# Patient Record
Sex: Male | Born: 1995 | Race: White | Hispanic: No | Marital: Single | Smoking: Current every day smoker
Health system: Southern US, Community
[De-identification: ages and names within clinical notes are randomized; demographics above are authoritative.]

---

## 2016-09-13 ENCOUNTER — Emergency Department: Payer: Self-pay

## 2016-09-13 ENCOUNTER — Emergency Department
Admission: EM | Admit: 2016-09-13 | Discharge: 2016-09-13 | Disposition: A | Discharge status: Home / Self Care | Payer: Self-pay | Attending: Emergency Medicine | Admitting: Emergency Medicine

## 2016-09-13 ENCOUNTER — Encounter: Payer: Self-pay | Admitting: Emergency Medicine

## 2016-09-13 DIAGNOSIS — F17210 Nicotine dependence, cigarettes, uncomplicated: Secondary | ICD-10-CM | POA: Insufficient documentation

## 2016-09-13 DIAGNOSIS — R079 Chest pain, unspecified: Secondary | ICD-10-CM | POA: Insufficient documentation

## 2016-09-13 DIAGNOSIS — R0602 Shortness of breath: Secondary | ICD-10-CM | POA: Insufficient documentation

## 2016-09-13 LAB — CBC
HEMATOCRIT: 49.3 % (ref 40.0–52.0)
HEMOGLOBIN: 16.6 g/dL (ref 13.0–18.0)
MCH: 32.8 pg (ref 26.0–34.0)
MCHC: 33.7 g/dL (ref 32.0–36.0)
MCV: 97.5 fL (ref 80.0–100.0)
Platelets: 272 10*3/uL (ref 150–440)
RBC: 5.05 MIL/uL (ref 4.40–5.90)
RDW: 12.7 % (ref 11.5–14.5)
WBC: 8.3 10*3/uL (ref 3.8–10.6)

## 2016-09-13 LAB — TROPONIN I

## 2016-09-13 LAB — BASIC METABOLIC PANEL
ANION GAP: 9 (ref 5–15)
BUN: 14 mg/dL (ref 6–20)
CALCIUM: 9.3 mg/dL (ref 8.9–10.3)
CO2: 26 mmol/L (ref 22–32)
Chloride: 103 mmol/L (ref 101–111)
Creatinine, Ser: 0.75 mg/dL (ref 0.61–1.24)
GFR calc Af Amer: >60 mL/min (ref >60)
Glucose, Bld: 103 mg/dL — ABNORMAL HIGH (ref 65–99)
POTASSIUM: 3.7 mmol/L (ref 3.5–5.1)
SODIUM: 138 mmol/L (ref 135–145)

## 2016-09-13 LAB — FIBRIN DERIVATIVES D-DIMER (ARMC ONLY): FIBRIN DERIVATIVES D-DIMER (ARMC): 67 (ref 0–499)

## 2016-09-13 MED ORDER — GI COCKTAIL ~~LOC~~
30.0000 mL | Freq: Once | ORAL | Status: AC
  Administered 2016-09-13: 30 mL via ORAL
  Filled 2016-09-13: qty 30

## 2016-09-13 MED ORDER — FAMOTIDINE 40 MG PO TABS: 40.0000 mg | ORAL_TABLET | Freq: Every evening | ORAL | 0 refills | Status: AC

## 2016-09-13 MED ORDER — TRAMADOL HCL 50 MG PO TABS
50.0000 mg | ORAL_TABLET | Freq: Once | ORAL | Status: AC
  Administered 2016-09-13: 50 mg via ORAL
  Filled 2016-09-13: qty 1

## 2016-09-13 MED ORDER — ACETAMINOPHEN 325 MG PO TABS
650.0000 mg | ORAL_TABLET | Freq: Once | ORAL | Status: AC
  Administered 2016-09-13: 650 mg via ORAL
  Filled 2016-09-13: qty 2

## 2016-09-13 NOTE — ED Notes (Signed)
Pt verbalized understanding of discharge instructions. NAD at this time. 

## 2016-09-13 NOTE — ED Provider Notes (Signed)
[facility] Emergency Department Provider Note   ____________________________________________   First MD Initiated Contact with Patient 09/13/16 [phone]     (approximate)  I have reviewed the triage vital signs and the nursing notes.   HISTORY  Chief Complaint Chest Pain    HPI Rodney Valentine is a 20 y.o. male who comes into the hospital today with some inflammation to his rib cage. He reports it is pushing on his chest cavity. He reports that he did have symptoms of this a few months ago and it went away with some ibuprofen. The patient woke up out of a sleep tonight with this pain. At time of left side of his chest. The patient reports that he came to find out why this keeps happening. He took some ibuprofen and then he reports that the pain went from being constant to intermittent. The patient reports that he took it around 3 AM. The patient rates his pain a 6 out of 10 in intensity. He reports that when he was seen last time he did not follow-up. He's had some shortness of breath with pain worse when he takes a deep breath. The patient denies any sweats dizziness, lightheadedness. He has some abdominal pain initially that improved. The patient also denies any nausea or vomiting. The patient is here for evaluation.   History reviewed. No pertinent past medical history.  There are no active problems to display for this patient.   History reviewed. No pertinent surgical history.  Prior to Admission medications   Medication Sig Start Date End Date Taking? Authorizing Provider  famotidine (PEPCID) 40 MG tablet Take 1 tablet (40 mg total) by mouth every evening. 09/13/16 09/13/17  Rebecka ApleyAllison P Anairis Knick, MD    Allergies Patient has no known allergies.  History reviewed. No pertinent family history.  Social History Social History  Substance Use Topics  . Smoking status: Current Every Day Smoker    Packs/day: 1.00    Types: Cigarettes  . Smokeless tobacco:  Never Used  . Alcohol use Yes    Review of Systems Constitutional: No fever/chills Eyes: No visual changes. ENT: No sore throat. Cardiovascular:  chest pain. Respiratory:  shortness of breath. Gastrointestinal: No abdominal pain.  No nausea, no vomiting.  No diarrhea.  No constipation. Genitourinary: Negative for dysuria. Musculoskeletal: Negative for back pain. Skin: Negative for rash. Neurological: Negative for headaches, focal weakness or numbness.  10-point ROS otherwise negative.  ____________________________________________   PHYSICAL EXAM:  VITAL SIGNS: ED Triage Vitals  Enc Vitals Group     BP 09/13/16 0431 122/64     Pulse Rate 09/13/16 0431 62     Resp 09/13/16 0431 18     Temp 09/13/16 0431 97.6 F (36.4 C)     Temp Source 09/13/16 0431 Oral     SpO2 09/13/16 0431 100 %     Weight 09/13/16 0429 125 lb (56.7 kg)     Height 09/13/16 0429 5\' 11"  (1.803 m)     Head Circumference --      Peak Flow --      Pain Score 09/13/16 0429 5     Pain Loc --      Pain Edu? --      Excl. in GC? --     Constitutional: Alert and oriented. Well appearing and in Mild distress. Eyes: Conjunctivae are normal. PERRL. EOMI. Head: Atraumatic. Nose: No congestion/rhinnorhea. Mouth/Throat: Mucous membranes are moist.  Oropharynx non-erythematous. Cardiovascular: Normal rate, regular rhythm. Grossly normal  heart sounds.  Good peripheral circulation. Respiratory: Normal respiratory effort.  No retractions. Lungs CTAB. Gastrointestinal: Soft and nontender. No distention. Positive bowel sounds Musculoskeletal: No lower extremity tenderness nor edema.   Neurologic:  Normal speech and language.  Skin:  Skin is warm, dry and intact.  Psychiatric: Mood and affect are normal.   ____________________________________________   LABS (all labs ordered are listed, but only abnormal results are displayed)  Labs Reviewed  BASIC METABOLIC PANEL - Abnormal; Notable for the following:        Result Value   Glucose, Bld 103 (*)    All other components within normal limits  CBC  TROPONIN I  FIBRIN DERIVATIVES D-DIMER ([facility] ONLY)   ____________________________________________  EKG  ED ECG REPORT I, Rebecka ApleyWebster,  Koston Hennes P, the attending physician, personally viewed and interpreted this ECG.   Date: 09/13/2016  EKG Time: 430  Rate: 67  Rhythm: normal sinus rhythm  Axis: normal  Intervals:none  ST&T Change: none  ____________________________________________  RADIOLOGY  CXR ____________________________________________   PROCEDURES  Procedure(s) performed: None  Procedures  Critical Care performed: No  ____________________________________________   INITIAL IMPRESSION / ASSESSMENT AND PLAN / ED COURSE  Pertinent labs & imaging results that were available during my care of the patient were reviewed by me and considered in my medical decision making (see chart for details).  This is a 20 year old male who comes into the hospital today with chest pain. The patient reports that he's had this pain before was told that it was due to inflammation but he does not know exactly why he keeps having it. I will give the patient a dose of tramadol as well as Tylenol. His initial d-dimer BNP CBC and troponin all negative. He'll receive a chest x-ray  Clinical Course as of Sep 13 744  Wynelle LinkSun Sep 13, 2016  [id number] No active cardiopulmonary disease. DG Chest 2 View [AW]    Clinical Course User Index [AW] Rebecka ApleyAllison P Saida Lonon, MD   The patient reports that after the medication he feels much improved. He reports that he thinks this could be due to his reflux as this pain did improve after the GI cocktail. I will discharge the patient to home and have him follow-up with his primary care physician. The patient understands and agrees with the plans as stated. He'll be discharged home.  ____________________________________________   FINAL CLINICAL IMPRESSION(S) / ED DIAGNOSES  Final  diagnoses:  Chest pain, unspecified type      NEW MEDICATIONS STARTED DURING THIS VISIT:  New Prescriptions   FAMOTIDINE (PEPCID) 40 MG TABLET    Take 1 tablet (40 mg total) by mouth every evening.     Note:  This document was prepared using Dragon voice recognition software and may include unintentional dictation errors.    Rebecka ApleyAllison P Haygen Zebrowski, MD 09/13/16 [phone]

## 2016-09-13 NOTE — ED Triage Notes (Addendum)
Pt reports 1 hour of pressure/tightness to the left side of his chest; some tightness in his left upper back; history of "inflammation" around his heart; pt says this feels similar but stronger pain; pt awake, alert and oriented x 3; talking in complete coherent sentences; pt adds pain woke him from sleep, feeling some better after taking Ibuprofen

## 2016-09-13 NOTE — ED Notes (Signed)
IV attempt unsuccessful by paramedic student Huntley DecSara to right arm.

## 2017-10-13 IMAGING — CR DG CHEST 2V
2 series · 2 of 2 positions shown · non-contrast
Comparison: None.

CLINICAL DATA: 20 y/o  M; chest pressure and tightness.

EXAM:
CHEST  2 VIEW

[chest pa]
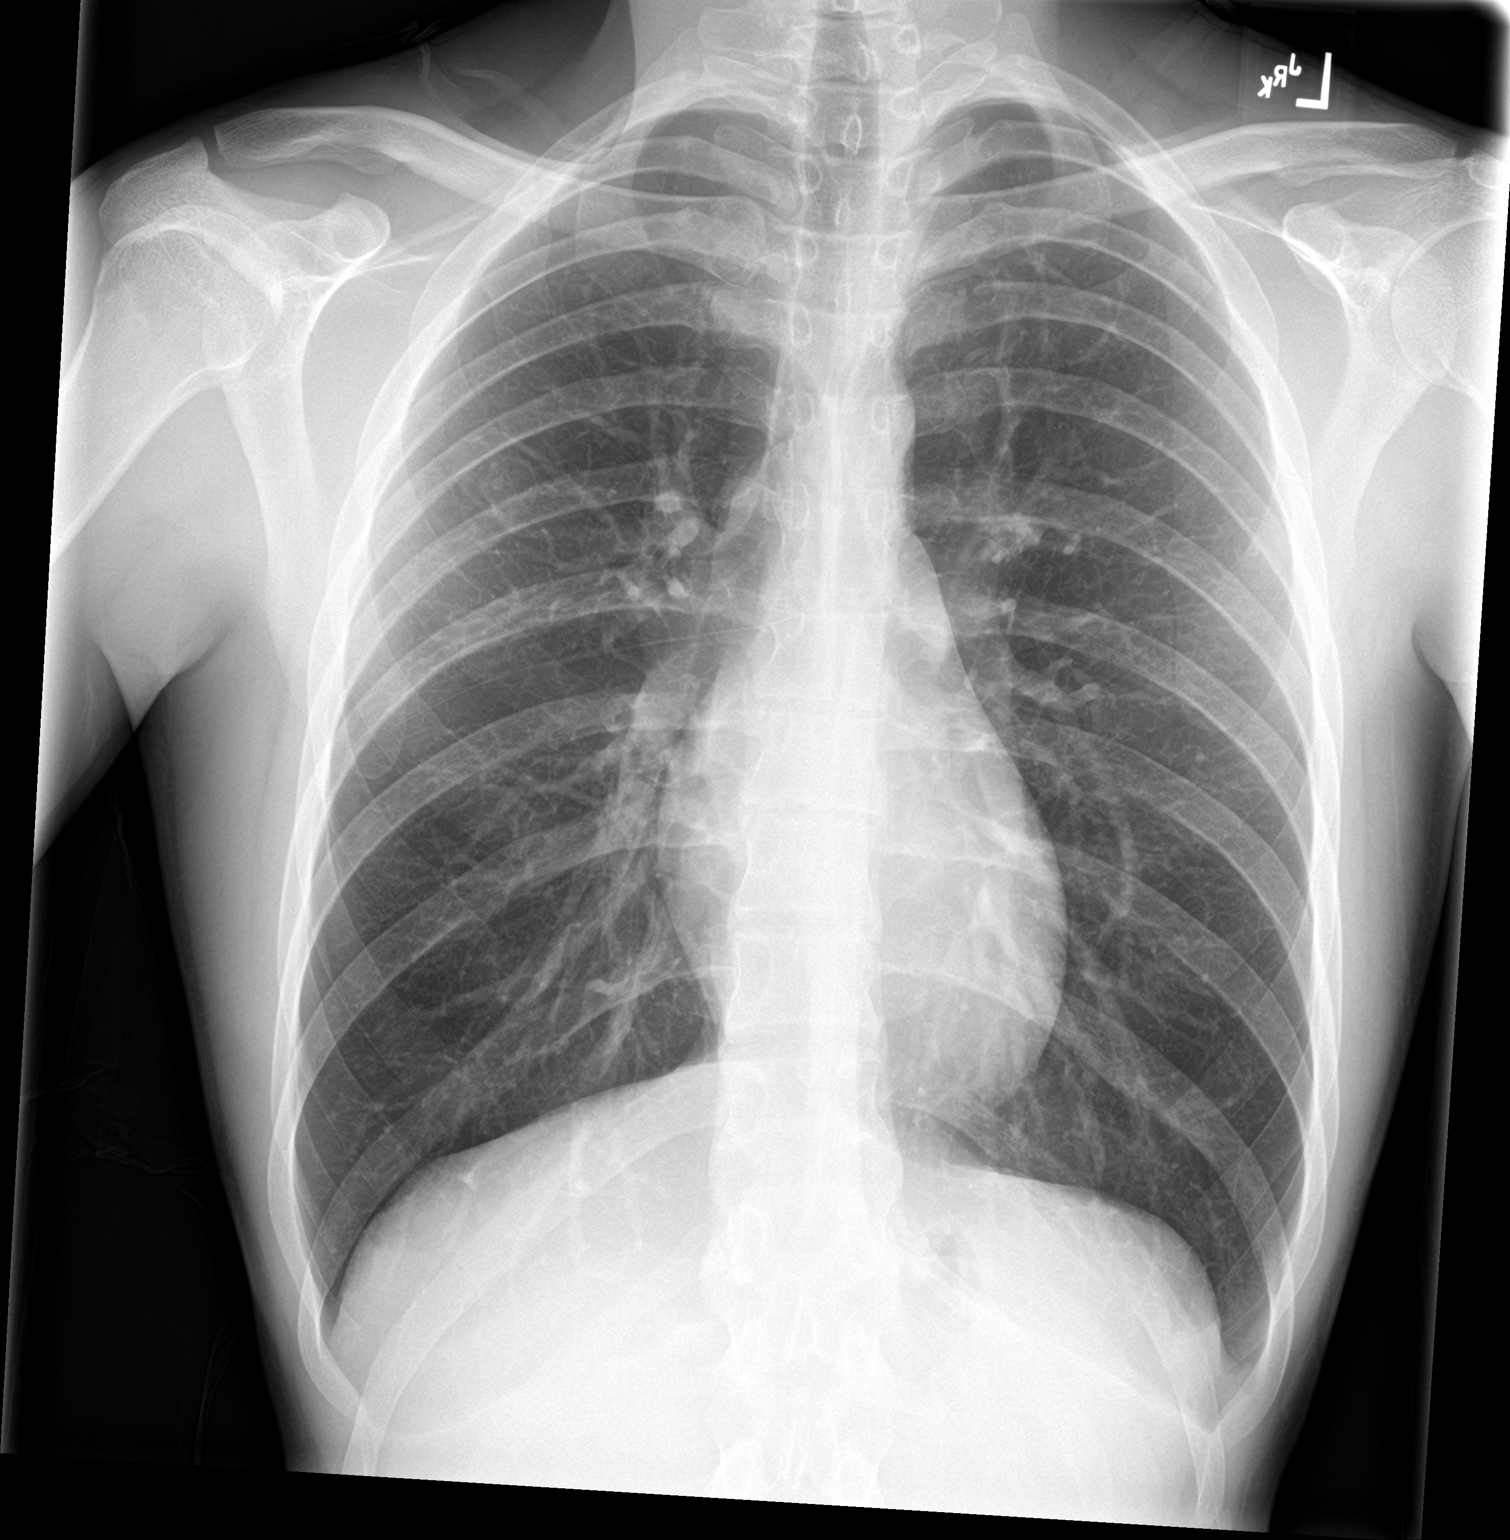

[chest lat]
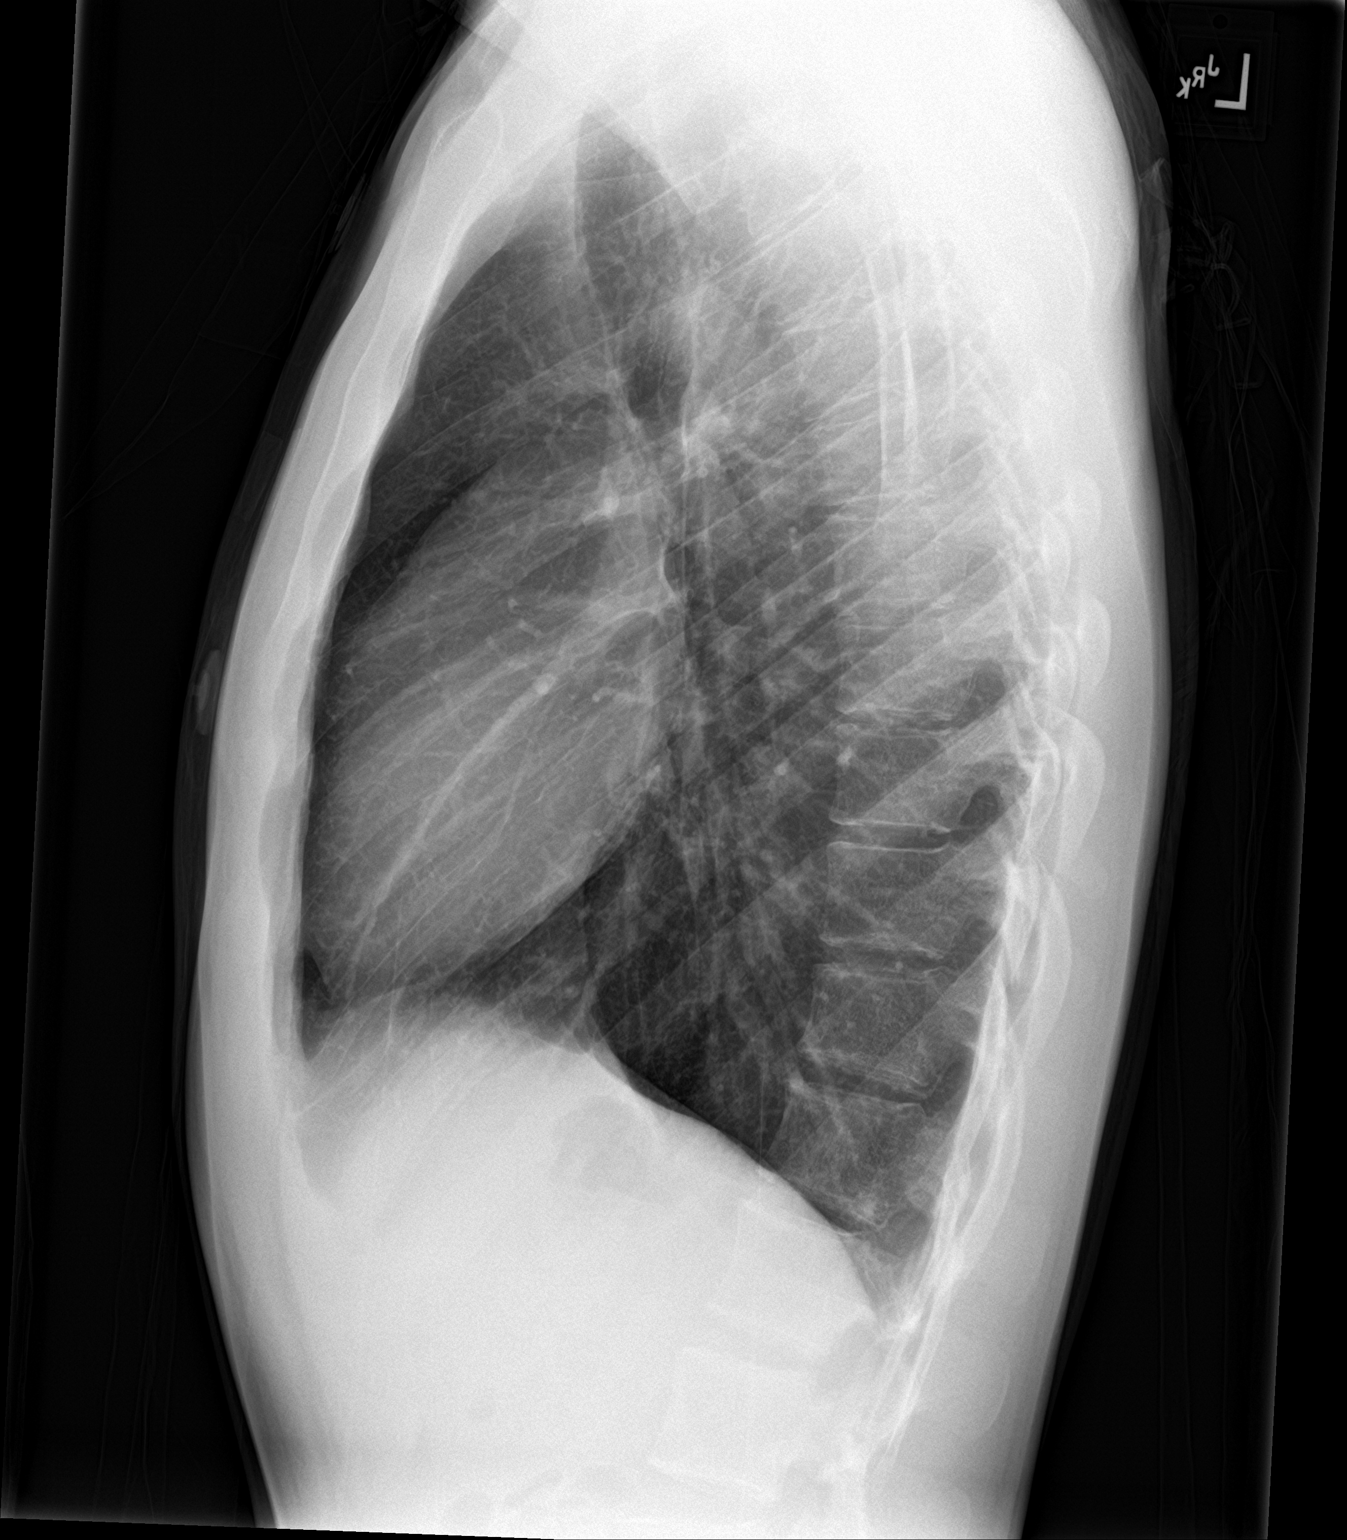

[2 of 2 positions shown; findings below may reference images not displayed]

FINDINGS: The heart size and mediastinal contours are within normal limits.
Both lungs are clear. The visualized skeletal structures are
unremarkable.
IMPRESSION: No active cardiopulmonary disease.

By: Yasmany Rattray M.D.
